# Patient Record
Sex: Male | Born: 1990 | Race: Black or African American | Hispanic: No | Marital: Single | Smoking: Current some day smoker
Health system: Southern US, Community
[De-identification: ages and names within clinical notes are randomized; demographics above are authoritative.]

## PROBLEM LIST (undated history)

## (undated) DIAGNOSIS — S02609A Fracture of mandible, unspecified, initial encounter for closed fracture: Secondary | ICD-10-CM

---
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
## 2016-01-28 ENCOUNTER — Emergency Department (HOSPITAL_COMMUNITY): Payer: Self-pay

## 2016-01-28 ENCOUNTER — Emergency Department (HOSPITAL_COMMUNITY): Payer: Self-pay | Admitting: Anesthesiology

## 2016-01-28 ENCOUNTER — Observation Stay (HOSPITAL_COMMUNITY)
Admission: EM | Admit: 2016-01-28 | Discharge: 2016-01-29 | Disposition: A | Discharge status: Home / Self Care | Payer: Self-pay | Attending: Otolaryngology | Admitting: Otolaryngology

## 2016-01-28 ENCOUNTER — Encounter (HOSPITAL_COMMUNITY): Payer: Self-pay | Admitting: Emergency Medicine

## 2016-01-28 ENCOUNTER — Encounter (HOSPITAL_COMMUNITY)
Admission: EM | Disposition: A | Discharge status: Home / Self Care | Payer: Self-pay | Source: Home / Self Care | Attending: Emergency Medicine

## 2016-01-28 DIAGNOSIS — S0993XA Unspecified injury of face, initial encounter: Secondary | ICD-10-CM

## 2016-01-28 DIAGNOSIS — S02609A Fracture of mandible, unspecified, initial encounter for closed fracture: Secondary | ICD-10-CM

## 2016-01-28 DIAGNOSIS — Y998 Other external cause status: Secondary | ICD-10-CM | POA: Insufficient documentation

## 2016-01-28 DIAGNOSIS — S02601A Fracture of unspecified part of body of right mandible, initial encounter for closed fracture: Principal | ICD-10-CM | POA: Insufficient documentation

## 2016-01-28 DIAGNOSIS — F10129 Alcohol abuse with intoxication, unspecified: Secondary | ICD-10-CM | POA: Insufficient documentation

## 2016-01-28 DIAGNOSIS — S02600B Fracture of unspecified part of body of mandible, initial encounter for open fracture: Secondary | ICD-10-CM | POA: Diagnosis present

## 2016-01-28 DIAGNOSIS — Y9389 Activity, other specified: Secondary | ICD-10-CM | POA: Insufficient documentation

## 2016-01-28 DIAGNOSIS — F10920 Alcohol use, unspecified with intoxication, uncomplicated: Secondary | ICD-10-CM

## 2016-01-28 DIAGNOSIS — S02601B Fracture of unspecified part of body of right mandible, initial encounter for open fracture: Secondary | ICD-10-CM

## 2016-01-28 DIAGNOSIS — S025XXA Fracture of tooth (traumatic), initial encounter for closed fracture: Secondary | ICD-10-CM | POA: Insufficient documentation

## 2016-01-28 DIAGNOSIS — Y9289 Other specified places as the place of occurrence of the external cause: Secondary | ICD-10-CM | POA: Insufficient documentation

## 2016-01-28 HISTORY — DX: Fracture of mandible, unspecified, initial encounter for closed fracture: S02.609A

## 2016-01-28 HISTORY — PX: ORIF MANDIBULAR FRACTURE: SHX2127

## 2016-01-28 SURGERY — OPEN REDUCTION INTERNAL FIXATION (ORIF) MANDIBULAR FRACTURE
Anesthesia: General | Site: Mouth

## 2016-01-28 MED ORDER — PROPOFOL 10 MG/ML IV BOLUS
INTRAVENOUS | Status: AC
  Filled 2016-01-28: qty 20

## 2016-01-28 MED ORDER — MIDAZOLAM HCL 2 MG/2ML IJ SOLN
INTRAMUSCULAR | Status: AC
  Filled 2016-01-28: qty 2

## 2016-01-28 MED ORDER — FENTANYL CITRATE (PF) 100 MCG/2ML IJ SOLN
INTRAMUSCULAR | Status: DC | PRN
  Administered 2016-01-28 (×2): 100 ug via INTRAVENOUS

## 2016-01-28 MED ORDER — DEXTROSE-NACL 5-0.45 % IV SOLN
INTRAVENOUS | Status: DC
  Administered 2016-01-28 – 2016-01-29 (×2): via INTRAVENOUS

## 2016-01-28 MED ORDER — SODIUM CHLORIDE 0.9 % IV SOLN
3.0000 g | Freq: Once | INTRAVENOUS | Status: AC
  Administered 2016-01-28: 3 g via INTRAVENOUS
  Filled 2016-01-28: qty 3

## 2016-01-28 MED ORDER — LACTATED RINGERS IV SOLN
Freq: Once | INTRAVENOUS | Status: AC
  Administered 2016-01-28: 50 mL/h via INTRAVENOUS

## 2016-01-28 MED ORDER — MIDAZOLAM HCL 2 MG/2ML IJ SOLN
INTRAMUSCULAR | Status: DC | PRN
  Administered 2016-01-28: 2 mg via INTRAVENOUS

## 2016-01-28 MED ORDER — MORPHINE SULFATE (PF) 4 MG/ML IV SOLN
4.0000 mg | INTRAVENOUS | Status: DC | PRN
  Administered 2016-01-28 – 2016-01-29 (×3): 4 mg via INTRAVENOUS
  Filled 2016-01-28 (×3): qty 1

## 2016-01-28 MED ORDER — ONDANSETRON HCL 4 MG/2ML IJ SOLN
4.0000 mg | Freq: Once | INTRAMUSCULAR | Status: AC
  Administered 2016-01-28: 4 mg via INTRAVENOUS
  Filled 2016-01-28: qty 2

## 2016-01-28 MED ORDER — OXYMETAZOLINE HCL 0.05 % NA SOLN
NASAL | Status: AC
  Filled 2016-01-28: qty 15

## 2016-01-28 MED ORDER — FENTANYL CITRATE (PF) 100 MCG/2ML IJ SOLN
INTRAMUSCULAR | Status: AC
  Filled 2016-01-28: qty 2

## 2016-01-28 MED ORDER — LIDOCAINE-EPINEPHRINE 1 %-1:100000 IJ SOLN
INTRAMUSCULAR | Status: DC | PRN
  Administered 2016-01-28: 5 mL

## 2016-01-28 MED ORDER — LIDOCAINE 2% (20 MG/ML) 5 ML SYRINGE
INTRAMUSCULAR | Status: DC | PRN
  Administered 2016-01-28: 60 mg via INTRAVENOUS

## 2016-01-28 MED ORDER — LIDOCAINE-EPINEPHRINE 1 %-1:100000 IJ SOLN
INTRAMUSCULAR | Status: AC
  Filled 2016-01-28: qty 1

## 2016-01-28 MED ORDER — ONDANSETRON 4 MG PO TBDP: 4.0000 mg | ORAL_TABLET | Freq: Four times a day (QID) | ORAL | Status: DC | PRN

## 2016-01-28 MED ORDER — OXYMETAZOLINE HCL 0.05 % NA SOLN
NASAL | Status: DC | PRN
  Administered 2016-01-28: 2 via TOPICAL

## 2016-01-28 MED ORDER — SUCCINYLCHOLINE CHLORIDE 200 MG/10ML IV SOSY
PREFILLED_SYRINGE | INTRAVENOUS | Status: AC
  Filled 2016-01-28: qty 10

## 2016-01-28 MED ORDER — MORPHINE SULFATE (PF) 4 MG/ML IV SOLN
4.0000 mg | Freq: Once | INTRAVENOUS | Status: AC
  Administered 2016-01-28: 4 mg via INTRAVENOUS
  Filled 2016-01-28: qty 1

## 2016-01-28 MED ORDER — PROMETHAZINE HCL 25 MG/ML IJ SOLN: 6.2500 mg | INTRAMUSCULAR | Status: DC | PRN

## 2016-01-28 MED ORDER — BACIT-POLY-NEO HC 1 % EX OINT
TOPICAL_OINTMENT | CUTANEOUS | Status: AC
  Filled 2016-01-28: qty 15

## 2016-01-28 MED ORDER — ONDANSETRON HCL 4 MG/2ML IJ SOLN
INTRAMUSCULAR | Status: AC
  Filled 2016-01-28: qty 2

## 2016-01-28 MED ORDER — LIDOCAINE 2% (20 MG/ML) 5 ML SYRINGE
INTRAMUSCULAR | Status: AC
  Filled 2016-01-28: qty 5

## 2016-01-28 MED ORDER — PROPOFOL 10 MG/ML IV BOLUS
INTRAVENOUS | Status: DC | PRN
  Administered 2016-01-28: 40 mg via INTRAVENOUS
  Administered 2016-01-28: 160 mg via INTRAVENOUS

## 2016-01-28 MED ORDER — HYDROMORPHONE HCL 1 MG/ML IJ SOLN: 0.2500 mg | INTRAMUSCULAR | Status: DC | PRN

## 2016-01-28 MED ORDER — SUCCINYLCHOLINE CHLORIDE 200 MG/10ML IV SOSY
PREFILLED_SYRINGE | INTRAVENOUS | Status: DC | PRN
  Administered 2016-01-28: 120 mg via INTRAVENOUS

## 2016-01-28 MED ORDER — OXYMETAZOLINE HCL 0.05 % NA SOLN
NASAL | Status: DC | PRN
  Administered 2016-01-28: 2 via NASAL

## 2016-01-28 MED ORDER — CLINDAMYCIN PHOSPHATE 600 MG/50ML IV SOLN
INTRAVENOUS | Status: DC | PRN
  Administered 2016-01-28: 600 mg via INTRAVENOUS

## 2016-01-28 MED ORDER — SCOPOLAMINE 1 MG/3DAYS TD PT72
MEDICATED_PATCH | TRANSDERMAL | Status: DC | PRN
  Administered 2016-01-28: 1 via TRANSDERMAL

## 2016-01-28 MED ORDER — LACTATED RINGERS IV SOLN
INTRAVENOUS | Status: DC | PRN
  Administered 2016-01-28 (×3): via INTRAVENOUS

## 2016-01-28 MED ORDER — ONDANSETRON HCL 4 MG/2ML IJ SOLN: 4.0000 mg | Freq: Four times a day (QID) | INTRAMUSCULAR | Status: DC | PRN

## 2016-01-28 MED ORDER — TETANUS-DIPHTH-ACELL PERTUSSIS 5-2.5-18.5 LF-MCG/0.5 IM SUSP
0.5000 mL | Freq: Once | INTRAMUSCULAR | Status: AC
  Administered 2016-01-28: 0.5 mL via INTRAMUSCULAR
  Filled 2016-01-28: qty 0.5

## 2016-01-28 MED ORDER — SCOPOLAMINE 1 MG/3DAYS TD PT72
MEDICATED_PATCH | TRANSDERMAL | Status: AC
  Filled 2016-01-28: qty 1

## 2016-01-28 MED ORDER — HYDROCODONE-ACETAMINOPHEN 7.5-325 MG/15ML PO SOLN
10.0000 mL | ORAL | Status: DC | PRN
  Administered 2016-01-28: 10 mL via ORAL
  Filled 2016-01-28 (×2): qty 15

## 2016-01-28 MED ORDER — CLINDAMYCIN PHOSPHATE 600 MG/50ML IV SOLN
600.0000 mg | Freq: Four times a day (QID) | INTRAVENOUS | Status: DC
Start: 2016-01-28 — End: 2016-01-29
  Administered 2016-01-28 – 2016-01-29 (×3): 600 mg via INTRAVENOUS
  Filled 2016-01-28 (×5): qty 50

## 2016-01-28 MED ORDER — 0.9 % SODIUM CHLORIDE (POUR BTL) OPTIME
TOPICAL | Status: DC | PRN
  Administered 2016-01-28: 1000 mL

## 2016-01-28 MED ORDER — BACIT-POLY-NEO HC 1 % EX OINT
TOPICAL_OINTMENT | CUTANEOUS | Status: DC | PRN
  Administered 2016-01-28: 1

## 2016-01-28 MED ORDER — DEXAMETHASONE SODIUM PHOSPHATE 10 MG/ML IJ SOLN
INTRAMUSCULAR | Status: DC | PRN
  Administered 2016-01-28: 10 mg via INTRAVENOUS

## 2016-01-28 MED ORDER — ONDANSETRON HCL 4 MG/2ML IJ SOLN
INTRAMUSCULAR | Status: DC | PRN
  Administered 2016-01-28: 4 mg via INTRAVENOUS

## 2016-01-28 MED ORDER — DEXAMETHASONE SODIUM PHOSPHATE 10 MG/ML IJ SOLN
INTRAMUSCULAR | Status: AC
  Filled 2016-01-28: qty 1

## 2016-01-28 MED ORDER — CLINDAMYCIN PHOSPHATE 600 MG/50ML IV SOLN
INTRAVENOUS | Status: AC
  Filled 2016-01-28: qty 50

## 2016-01-28 SURGICAL SUPPLY — 45 items
BIT DRILL TWIST 1.3X79 (BIT) ×1 IMPLANT
BLADE 10 SAFETY STRL DISP (BLADE) IMPLANT
BLADE SURG 15 STRL LF DISP TIS (BLADE) ×1 IMPLANT
BLADE SURG 15 STRL SS (BLADE) ×2
CANISTER SUCTION 2500CC (MISCELLANEOUS) ×3 IMPLANT
CLEANER TIP ELECTROSURG 2X2 (MISCELLANEOUS) ×3 IMPLANT
CONFORMERS SILICONE 5649 (OPHTHALMIC RELATED) IMPLANT
COVER SURGICAL LIGHT HANDLE (MISCELLANEOUS) ×3 IMPLANT
CRADLE DONUT ADULT HEAD (MISCELLANEOUS) ×3 IMPLANT
DECANTER SPIKE VIAL GLASS SM (MISCELLANEOUS) ×3 IMPLANT
DRAPE PROXIMA HALF (DRAPES) IMPLANT
DRILL BIT TWIST 1.3X79MM (BIT) ×2
DRSG MEPILEX BORDER 4X4 (GAUZE/BANDAGES/DRESSINGS) ×3 IMPLANT
ELECT COATED BLADE 2.86 ST (ELECTRODE) ×3 IMPLANT
ELECT NEEDLE BLADE 2-5/6 (NEEDLE) IMPLANT
ELECT REM PT RETURN 9FT ADLT (ELECTROSURGICAL) ×3
ELECTRODE REM PT RTRN 9FT ADLT (ELECTROSURGICAL) ×1 IMPLANT
GLOVE ECLIPSE 7.5 STRL STRAW (GLOVE) ×3 IMPLANT
GOWN STRL REUS W/ TWL LRG LVL3 (GOWN DISPOSABLE) ×2 IMPLANT
GOWN STRL REUS W/TWL LRG LVL3 (GOWN DISPOSABLE) ×4
KIT BASIN OR (CUSTOM PROCEDURE TRAY) ×3 IMPLANT
KIT ROOM TURNOVER OR (KITS) ×3 IMPLANT
NEEDLE HYPO 25GX1X1/2 BEV (NEEDLE) ×3 IMPLANT
NS IRRIG 1000ML POUR BTL (IV SOLUTION) ×3 IMPLANT
PAD ARMBOARD 7.5X6 YLW CONV (MISCELLANEOUS) ×6 IMPLANT
PATTIES SURGICAL .5 X3 (DISPOSABLE) ×3 IMPLANT
PENCIL FOOT CONTROL (ELECTRODE) ×3 IMPLANT
PLATE HYBRID MMF (Plate) ×6 IMPLANT
PLATE MID FACE 4H CURVED (Plate) ×3 IMPLANT
PLATE MID FACE 6H L 8MM (Plate) ×3 IMPLANT
SCREW 1.7X6 CROSS-PINSTRLELF-D (Screw) ×3 IMPLANT
SCREW LOCK SELFDRIL 2.0X8M MMF (Screw) ×48 IMPLANT
SCREW MIDFACE 1.7X4 SLF DRILL (Screw) ×6 IMPLANT
SCREW UPPER FACE 2.0X12MM (Screw) ×6 IMPLANT
SUT CHROMIC 3 0 PS 2 (SUTURE) ×3 IMPLANT
SUT CHROMIC 4 0 PS 2 18 (SUTURE) IMPLANT
SUT ETHILON 5 0 P 3 18 (SUTURE)
SUT NYLON ETHILON 5-0 P-3 1X18 (SUTURE) IMPLANT
SUT SILK 2 0 FS (SUTURE) IMPLANT
SUT STEEL 0 (SUTURE)
SUT STEEL 0 18XMFL TIE 17 (SUTURE) IMPLANT
SUT STEEL 4 (SUTURE) ×3 IMPLANT
TOWEL OR 17X24 6PK STRL BLUE (TOWEL DISPOSABLE) ×3 IMPLANT
TRAY ENT MC OR (CUSTOM PROCEDURE TRAY) ×3 IMPLANT
WATER STERILE IRR 1000ML POUR (IV SOLUTION) ×3 IMPLANT
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
## 2016-01-28 NOTE — Anesthesia Preprocedure Evaluation (Signed)
Anesthesia Evaluation  Patient identified by MRN, date of birth, ID band Patient awake  General Assessment Comment:Fractured jaw  Reviewed: Allergy & Precautions, NPO status , Patient's Chart, lab work & pertinent test results  Airway Mallampati: II   Neck ROM: Full  Mouth opening: Limited Mouth Opening  Dental  (+) Teeth Intact, Dental Advisory Given   Pulmonary neg pulmonary ROS,    breath sounds clear to auscultation       Cardiovascular negative cardio ROS   Rhythm:Regular Rate:Normal     Neuro/Psych negative neurological ROS  negative psych ROS   GI/Hepatic negative GI ROS, Neg liver ROS, (+)     substance abuse  alcohol use,   Endo/Other  negative endocrine ROS  Renal/GU negative Renal ROS  negative genitourinary   Musculoskeletal negative musculoskeletal ROS (+)   Abdominal   Peds negative pediatric ROS (+)  Hematology negative hematology ROS (+)   Anesthesia Other Findings   Reproductive/Obstetrics negative OB ROS                             Anesthesia Physical Anesthesia Plan  ASA: I and emergent  Anesthesia Plan: General   Post-op Pain Management:    Induction: Intravenous  Airway Management Planned: Nasal ETT  Additional Equipment:   Intra-op Plan:   Post-operative Plan: Extubation in OR  Informed Consent: I have reviewed the patients History and Physical, chart, labs and discussed the procedure including the risks, benefits and alternatives for the proposed anesthesia with the patient or authorized representative who has indicated his/her understanding and acceptance.   Dental advisory given  Plan Discussed with:   Anesthesia Plan Comments:         Anesthesia Quick Evaluation

## 2016-01-28 NOTE — Anesthesia Procedure Notes (Signed)
Procedure Name: Intubation Date/Time: 01/28/2016 2:17 PM Performed by: Alanda AmassFRIEDMAN, Maeve Debord A Pre-anesthesia Checklist: Patient identified, Emergency Drugs available, Suction available, Patient being monitored and Timeout performed Patient Re-evaluated:Patient Re-evaluated prior to inductionOxygen Delivery Method: Circle System Utilized and Circle system utilized Preoxygenation: Pre-oxygenation with 100% oxygen Intubation Type: IV induction Ventilation: Mask ventilation without difficulty Laryngoscope Size: Glidescope Nasal Tubes: Nasal Rae and Nasal prep performed Tube size: 7.0 mm Number of attempts: 1 Airway Equipment and Method: Video-laryngoscopy Placement Confirmation: ETT inserted through vocal cords under direct vision,  positive ETCO2 and breath sounds checked- equal and bilateral Secured at: 26 cm Tube secured with: Tape Dental Injury: Teeth and Oropharynx as per pre-operative assessment  Comments: Lt. nare

## 2016-01-28 NOTE — Anesthesia Postprocedure Evaluation (Signed)
Anesthesia Post Note  Patient: Collin StarrMichael L Frieden Jr.  Procedure(s) Performed: Procedure(s) (LRB): OPEN REDUCTION INTERNAL FIXATION (ORIF) MANDIBULAR FRACTURE (N/A)  Patient location during evaluation: PACU Anesthesia Type: General Level of consciousness: awake and alert Pain management: pain level controlled Vital Signs Assessment: post-procedure vital signs reviewed and stable Respiratory status: spontaneous breathing, nonlabored ventilation, respiratory function stable and patient connected to nasal cannula oxygen Cardiovascular status: blood pressure returned to baseline and stable Postop Assessment: no signs of nausea or vomiting Anesthetic complications: no    Last Vitals:  Vitals:   01/28/16 1645 01/28/16 1700  BP: (!) 155/79 (!) 154/77  Pulse: 96 (!) 102  Resp: 16 14  Temp: 37.2 C     Last Pain:  Vitals:   01/28/16 1645  TempSrc:   PainSc: 0-No pain                 Priyah Schmuck,JAMES TERRILL
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
## 2016-01-28 NOTE — Progress Notes (Signed)
Spoke with Dr. Jacklynn BueMassagee who stated no labs were needed.
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
## 2016-01-28 NOTE — ED Notes (Signed)
Bed: WA09 Expected date:  Expected time:  Means of arrival:  Comments: 25yo M ETOH

## 2016-01-28 NOTE — Progress Notes (Signed)
Pt awake , alert, low pain, ice pack to jaws, no nausea, no oral bleeding noted

## 2016-01-28 NOTE — ED Notes (Signed)
Patient awake and spitting blood into sink. PA and RN made aware.

## 2016-01-28 NOTE — Transfer of Care (Signed)
Immediate Anesthesia Transfer of Care Note  Patient: Collin StarrMichael L Sailer Jr.  Procedure(s) Performed: Procedure(s): OPEN REDUCTION INTERNAL FIXATION (ORIF) MANDIBULAR FRACTURE (N/A)  Patient Location: PACU  Anesthesia Type:General  Level of Consciousness: awake  Airway & Oxygen Therapy: Patient Spontanous Breathing and Patient connected to nasal cannula oxygen  Post-op Assessment: Report given to RN and Post -op Vital signs reviewed and stable  Post vital signs: Reviewed and stable  Last Vitals:  Vitals:   01/28/16 1100 01/28/16 1611  BP: 131/61   Pulse: 78 (!) (P) 107  Resp: 16 (P) 12  Temp:  (P) 36.8 C    Last Pain:  Vitals:   01/28/16 1121  TempSrc:   PainSc: 9          Complications: No apparent anesthesia complications

## 2016-01-29 MED ORDER — CEPHALEXIN 250 MG/5ML PO SUSR
500.0000 mg | Freq: Three times a day (TID) | ORAL | Status: DC
  Administered 2016-01-29: 500 mg via ORAL
  Filled 2016-01-29 (×2): qty 10

## 2016-01-29 MED ORDER — CEPHALEXIN 250 MG/5ML PO SUSR: 500.0000 mg | Freq: Three times a day (TID) | ORAL | 0 refills | Status: DC

## 2016-01-29 MED ORDER — HYDROCODONE-ACETAMINOPHEN 7.5-325 MG/15ML PO SOLN: 10.0000 mL | ORAL | 0 refills | Status: DC | PRN

## 2016-01-29 NOTE — Discharge Summary (Signed)
Physician Discharge Summary  Patient ID: Collin StarrMichael L Stefano Jr. MRN: [id number] DOB/AGE: [birth year] 25 y.o.  Admit date: 01/28/2016 Discharge date: 01/29/2016  Admission Diagnoses:Mandible fracture  Discharge Diagnoses: Same Active Problems:   Open body of mandible fracture [facility](HCC)   Discharged Condition: good  Hospital Course: Patient was admitted for open reduction internal fixation of mandible fracture. He tolerated procedure well. He postop day 1 was doing well. Pain was controlled. Wires seem tight and Tc no be lined up. He was discharged with his wire cutters. He's to follow-up in one week. Discharge instructions were given.  Consults: None  Significant Diagnostic Studies: None  Treatments: surgery: As above  Discharge Exam: Blood pressure 130/75, pulse 63, temperature 97.7 F (36.5 C), temperature source Axillary, resp. rate 17, SpO2 100 %. Awake and alert. Wires are tight. No breathing difficulty. No excessive swelling. Lungs are clear. Heart is regular. Extremities no swelling or tenderness.  Disposition: Final discharge disposition not confirmed  Discharge Instructions    Call MD for:  difficulty breathing, headache or visual disturbances    Complete by:  As directed    Call MD for:  extreme fatigue    Complete by:  As directed    Call MD for:  hives    Complete by:  As directed    Call MD for:  persistant dizziness or light-headedness    Complete by:  As directed    Call MD for:  persistant nausea and vomiting    Complete by:  As directed    Call MD for:  redness, tenderness, or signs of infection (pain, swelling, redness, odor or green/yellow discharge around incision site)    Complete by:  As directed    Call MD for:  severe uncontrolled pain    Complete by:  As directed    Call MD for:  temperature >100.4    Complete by:  As directed    Diet - low sodium heart healthy    Complete by:  As directed    Discharge instructions    Complete by:  As directed    Call if any increasing swelling, pain, or any of the wires her teeth are loose. Follow-up in one week in the office at [phone]. Take the antibiotic as prescribed. Clean the teeth with a WaterPik or salt water gargle. Full liquid diet is usually the typical calorie intake. Call with any questions whatsoever. You must keep your wire cutters with you at all times for any emergency to cut the wires.   Increase activity slowly    Complete by:  As directed        Medication List    TAKE these medications   cephALEXin 250 MG/5ML suspension Commonly known as:  KEFLEX Take 10 mLs (500 mg total) by mouth every 8 (eight) hours.   HYDROcodone-acetaminophen 7.5-325 mg/15 ml solution Commonly known as:  HYCET Take 10 mLs by mouth every 4 (four) hours as needed for moderate pain.   hydroxypropyl methylcellulose / hypromellose 2.5 % ophthalmic solution Commonly known as:  ISOPTO TEARS / GONIOVISC Place 1 drop into both eyes 3 (three) times daily as needed for dry eyes.        SignedSuzanna Obey: Collin Sullivan 01/29/2016, 8:27 AM

## 2016-01-29 NOTE — Progress Notes (Signed)
Pt was up and ambulating in the room. Good pain relief from pain meds this morning. Min swelling to face, dry abrasions to right face.  Dr Jearld FentonByers seen pt this morning w/ discharge order. Discharge instructions given to pt, wire cutter given.  Discharged home accompanied by mother.

## 2016-02-01 ENCOUNTER — Encounter (HOSPITAL_COMMUNITY): Payer: Self-pay | Admitting: Otolaryngology

## 2016-03-04 ENCOUNTER — Encounter (HOSPITAL_BASED_OUTPATIENT_CLINIC_OR_DEPARTMENT_OTHER): Payer: Self-pay | Admitting: *Deleted

## 2016-03-04 ENCOUNTER — Other Ambulatory Visit: Payer: Self-pay | Admitting: Otolaryngology

## 2016-03-07 ENCOUNTER — Encounter (HOSPITAL_BASED_OUTPATIENT_CLINIC_OR_DEPARTMENT_OTHER)
Admission: RE | Disposition: A | Discharge status: Home / Self Care | Payer: Self-pay | Source: Ambulatory Visit | Attending: Otolaryngology

## 2016-03-07 ENCOUNTER — Encounter (HOSPITAL_BASED_OUTPATIENT_CLINIC_OR_DEPARTMENT_OTHER): Payer: Self-pay | Admitting: *Deleted

## 2016-03-07 ENCOUNTER — Ambulatory Visit (HOSPITAL_BASED_OUTPATIENT_CLINIC_OR_DEPARTMENT_OTHER): Payer: Self-pay | Admitting: Anesthesiology

## 2016-03-07 ENCOUNTER — Ambulatory Visit (HOSPITAL_BASED_OUTPATIENT_CLINIC_OR_DEPARTMENT_OTHER)
Admission: RE | Admit: 2016-03-07 | Discharge: 2016-03-07 | Disposition: A | Discharge status: Home / Self Care | Payer: Self-pay | Source: Ambulatory Visit | Attending: Otolaryngology | Admitting: Otolaryngology

## 2016-03-07 DIAGNOSIS — Z472 Encounter for removal of internal fixation device: Secondary | ICD-10-CM | POA: Insufficient documentation

## 2016-03-07 DIAGNOSIS — F17200 Nicotine dependence, unspecified, uncomplicated: Secondary | ICD-10-CM | POA: Insufficient documentation

## 2016-03-07 HISTORY — PX: MANDIBULAR HARDWARE REMOVAL: SHX5205

## 2016-03-07 HISTORY — DX: Fracture of mandible, unspecified, initial encounter for closed fracture: S02.609A

## 2016-03-07 SURGERY — REMOVAL, HARDWARE, MANDIBLE
Anesthesia: General | Site: Mouth | Laterality: Bilateral

## 2016-03-07 MED ORDER — SCOPOLAMINE 1 MG/3DAYS TD PT72: 1.0000 | MEDICATED_PATCH | Freq: Once | TRANSDERMAL | Status: DC | PRN

## 2016-03-07 MED ORDER — MIDAZOLAM HCL 2 MG/2ML IJ SOLN
INTRAMUSCULAR | Status: AC
  Filled 2016-03-07: qty 2

## 2016-03-07 MED ORDER — FENTANYL CITRATE (PF) 100 MCG/2ML IJ SOLN
50.0000 ug | INTRAMUSCULAR | Status: DC | PRN
  Administered 2016-03-07: 100 ug via INTRAVENOUS

## 2016-03-07 MED ORDER — ONDANSETRON HCL 4 MG/2ML IJ SOLN
INTRAMUSCULAR | Status: AC
  Filled 2016-03-07: qty 2

## 2016-03-07 MED ORDER — PROMETHAZINE HCL 25 MG/ML IJ SOLN: 6.2500 mg | INTRAMUSCULAR | Status: DC | PRN

## 2016-03-07 MED ORDER — BACITRACIN 500 UNIT/GM EX OINT
TOPICAL_OINTMENT | CUTANEOUS | Status: DC | PRN
  Administered 2016-03-07: 1 via TOPICAL

## 2016-03-07 MED ORDER — DEXAMETHASONE SODIUM PHOSPHATE 4 MG/ML IJ SOLN
INTRAMUSCULAR | Status: DC | PRN
  Administered 2016-03-07: 10 mg via INTRAVENOUS

## 2016-03-07 MED ORDER — DEXAMETHASONE SODIUM PHOSPHATE 10 MG/ML IJ SOLN
INTRAMUSCULAR | Status: AC
  Filled 2016-03-07: qty 1

## 2016-03-07 MED ORDER — PROPOFOL 10 MG/ML IV BOLUS
INTRAVENOUS | Status: DC | PRN
  Administered 2016-03-07: 50 mg via INTRAVENOUS
  Administered 2016-03-07: 150 mg via INTRAVENOUS

## 2016-03-07 MED ORDER — FENTANYL CITRATE (PF) 100 MCG/2ML IJ SOLN: 25.0000 ug | INTRAMUSCULAR | Status: DC | PRN

## 2016-03-07 MED ORDER — FENTANYL CITRATE (PF) 100 MCG/2ML IJ SOLN
INTRAMUSCULAR | Status: AC
  Filled 2016-03-07: qty 2

## 2016-03-07 MED ORDER — LIDOCAINE-EPINEPHRINE 2 %-1:100000 IJ SOLN
INTRAMUSCULAR | Status: AC
Start: 2016-03-07 — End: 2016-03-07
  Filled 2016-03-07: qty 1.7

## 2016-03-07 MED ORDER — MEPERIDINE HCL 25 MG/ML IJ SOLN: 6.2500 mg | INTRAMUSCULAR | Status: DC | PRN

## 2016-03-07 MED ORDER — MIDAZOLAM HCL 2 MG/2ML IJ SOLN: 0.5000 mg | Freq: Once | INTRAMUSCULAR | Status: DC | PRN

## 2016-03-07 MED ORDER — ONDANSETRON HCL 4 MG/2ML IJ SOLN
INTRAMUSCULAR | Status: DC | PRN
  Administered 2016-03-07: 4 mg via INTRAVENOUS

## 2016-03-07 MED ORDER — MIDAZOLAM HCL 2 MG/2ML IJ SOLN
1.0000 mg | INTRAMUSCULAR | Status: DC | PRN
  Administered 2016-03-07: 2 mg via INTRAVENOUS

## 2016-03-07 MED ORDER — LACTATED RINGERS IV SOLN
INTRAVENOUS | Status: DC
  Administered 2016-03-07: 12:00:00 via INTRAVENOUS

## 2016-03-07 SURGICAL SUPPLY — 26 items
BLADE SURG 15 STRL LF DISP TIS (BLADE) ×1 IMPLANT
BLADE SURG 15 STRL SS (BLADE) ×2
CANISTER SUCT 1200ML W/VALVE (MISCELLANEOUS) ×3 IMPLANT
CORDS BIPOLAR (ELECTRODE) IMPLANT
COVER MAYO STAND STRL (DRAPES) ×3 IMPLANT
DECANTER SPIKE VIAL GLASS SM (MISCELLANEOUS) ×3 IMPLANT
GLOVE BIOGEL PI IND STRL 7.5 (GLOVE) ×1 IMPLANT
GLOVE BIOGEL PI INDICATOR 7.5 (GLOVE) ×2
GLOVE SS BIOGEL STRL SZ 7.5 (GLOVE) ×1 IMPLANT
GLOVE SUPERSENSE BIOGEL SZ 7.5 (GLOVE) ×2
GLOVE SURG SYN 7.5  E (GLOVE) ×2
GLOVE SURG SYN 7.5 E (GLOVE) ×1 IMPLANT
GOWN STRL REUS W/ TWL LRG LVL3 (GOWN DISPOSABLE) ×2 IMPLANT
GOWN STRL REUS W/TWL LRG LVL3 (GOWN DISPOSABLE) ×4
MARKER SKIN DUAL TIP RULER LAB (MISCELLANEOUS) IMPLANT
NEEDLE PRECISIONGLIDE 27X1.5 (NEEDLE) ×3 IMPLANT
PACK BASIN DAY SURGERY FS (CUSTOM PROCEDURE TRAY) ×3 IMPLANT
SCISSORS WIRE ANG 4 3/4 DISP (INSTRUMENTS) IMPLANT
SHEET MEDIUM DRAPE 40X70 STRL (DRAPES) ×3 IMPLANT
SUT CHROMIC 3 0 PS 2 (SUTURE) IMPLANT
SUT CHROMIC 4 0 PS 2 18 (SUTURE) IMPLANT
SYR CONTROL 10ML LL (SYRINGE) ×3 IMPLANT
TOWEL OR 17X24 6PK STRL BLUE (TOWEL DISPOSABLE) ×6 IMPLANT
TUBE CONNECTING 20'X1/4 (TUBING) ×1
TUBE CONNECTING 20X1/4 (TUBING) ×2 IMPLANT
YANKAUER SUCT BULB TIP NO VENT (SUCTIONS) ×3 IMPLANT

## 2016-03-07 NOTE — Anesthesia Postprocedure Evaluation (Signed)
Anesthesia Post Note  Patient: Collin StarrMichael L Breau Jr.  Procedure(s) Performed: Procedure(s) (LRB): MANDIBULAR HARDWARE REMOVAL (Bilateral)  Patient location during evaluation: PACU Anesthesia Type: General Level of consciousness: awake and alert, patient cooperative and oriented Pain management: pain level controlled Vital Signs Assessment: post-procedure vital signs reviewed and stable Respiratory status: spontaneous breathing, nonlabored ventilation and respiratory function stable Cardiovascular status: blood pressure returned to baseline and stable Postop Assessment: no signs of nausea or vomiting Anesthetic complications: no    Last Vitals:  Vitals:   03/07/16 1505 03/07/16 1519  BP:  117/74  Pulse: (!) 50 90  Resp: 14 16  Temp:  36.6 C    Last Pain:  Vitals:   03/07/16 1519  TempSrc: Oral  PainSc: 0-No pain                 Collin Sullivan,E. Natina Wiginton

## 2016-03-07 NOTE — Discharge Instructions (Signed)
Stay on a soft diet for another week at least. Follow-up in 2-3 weeks for recheck in the office. Gargle with some salt water to cleanse the teeth for about 2 or 3 days. Call if there is any issues, concerns, or problems.     Post Anesthesia Home Care Instructions  Activity: Get plenty of rest for the remainder of the day. A responsible adult should stay with you for 24 hours following the procedure.  For the next 24 hours, DO NOT: -Drive a car -Advertising copywriterperate machinery -Drink alcoholic beverages -Take any medication unless instructed by your physician -Make any legal decisions or sign important papers.  Meals: Start with liquid foods such as gelatin or soup. Progress to regular foods as tolerated. Avoid greasy, spicy, heavy foods. If nausea and/or vomiting occur, drink only clear liquids until the nausea and/or vomiting subsides. Call your physician if vomiting continues.  Special Instructions/Symptoms: Your throat may feel dry or sore from the anesthesia or the breathing tube placed in your throat during surgery. If this causes discomfort, gargle with warm salt water. The discomfort should disappear within 24 hours.  If you had a scopolamine patch placed behind your ear for the management of post- operative nausea and/or vomiting:  1. The medication in the patch is effective for 72 hours, after which it should be removed.  Wrap patch in a tissue and discard in the trash. Wash hands thoroughly with soap and water. 2. You may remove the patch earlier than 72 hours if you experience unpleasant side effects which may include dry mouth, dizziness or visual disturbances. 3. Avoid touching the patch. Wash your hands with soap and water after contact with the patch.   Call your surgeon if you experience:   1.  Fever over 101.0. 2.  Inability to urinate. 3.  Nausea and/or vomiting. 4.  Extreme swelling or bruising at the surgical site. 5.  Continued bleeding from the incision. 6.  Increased pain,  redness or drainage from the incision. 7.  Problems related to your pain medication. 8.  Any problems and/or concerns
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
## 2016-03-07 NOTE — H&P (Signed)
Collin StarrMichael L Suchy Jr. is an 25 y.o. male.   Chief Complaint: jaw fracture HPI: hx of mandible fx and now needs arch bars off  Past Medical History:  Diagnosis Date  . Mandible fracture (HCC) 01/28/2016    Past Surgical History:  Procedure Laterality Date  . ORIF MANDIBULAR FRACTURE N/A 01/28/2016   Procedure: OPEN REDUCTION INTERNAL FIXATION (ORIF) MANDIBULAR FRACTURE;  Surgeon: Suzanna ObeyJohn Kynnadi Dicenso, MD;  [facility];  Service: ENT;  Laterality: N/A;    History reviewed. No pertinent family history. Social History:  reports that he has been smoking.  He has smoked for the past 4.00 years. He has never used smokeless tobacco. He reports that he drinks alcohol. He reports that he does not use drugs.  Allergies: No Known Allergies  No prescriptions prior to admission.    No results found for this or any previous visit (from the past 48 hour(s)). No results found.  Review of Systems  Constitutional: Negative.   HENT: Negative.   Eyes: Negative.   Respiratory: Negative.   Cardiovascular: Negative.   Skin: Negative.   Neurological: Negative.     Blood pressure (!) 108/59, pulse (!) 59, temperature 98.1 F (36.7 C), temperature source Oral, resp. rate 18, height 6\' 1"  (1.854 m), weight 73 kg (161 lb), SpO2 100 %. Physical Exam  Constitutional: He appears well-developed and well-nourished.  HENT:  Head: Normocephalic and atraumatic.  Nose: Nose normal.  Eyes: Conjunctivae are normal. Pupils are equal, round, and reactive to light.  Neck: Normal range of motion. Neck supple.  Cardiovascular: Normal rate.   Respiratory: Effort normal.  GI: Soft.  Musculoskeletal: Normal range of motion.     Assessment/Plan Mandible fracture- he has healed well with no problems and good occlusion and now for arch bar removal./  Suzanna ObeyBYERS, Collin Fuster, MD 03/07/2016, 1:00 PM

## 2016-03-07 NOTE — Anesthesia Preprocedure Evaluation (Addendum)
Anesthesia Evaluation  Patient identified by MRN, date of birth, ID band Patient awake    Reviewed: Allergy & Precautions, NPO status , Patient's Chart, lab work & pertinent test results  History of Anesthesia Complications Negative for: history of anesthetic complications  Airway Mallampati: II  TM Distance: >3 FB Neck ROM: Full    Dental  (+) Dental Advisory Given   Pulmonary Current Smoker,    breath sounds clear to auscultation       Cardiovascular negative cardio ROS   Rhythm:Regular Rate:Normal     Neuro/Psych negative neurological ROS     GI/Hepatic negative GI ROS, Neg liver ROS,   Endo/Other  negative endocrine ROS  Renal/GU negative Renal ROS     Musculoskeletal   Abdominal   Peds  Hematology negative hematology ROS (+)   Anesthesia Other Findings   Reproductive/Obstetrics                            Anesthesia Physical Anesthesia Plan  ASA: II  Anesthesia Plan: General   Post-op Pain Management:    Induction: Intravenous  Airway Management Planned: Mask  Additional Equipment:   Intra-op Plan:   Post-operative Plan:   Informed Consent: I have reviewed the patients History and Physical, chart, labs and discussed the procedure including the risks, benefits and alternatives for the proposed anesthesia with the patient or authorized representative who has indicated his/her understanding and acceptance.   Dental advisory given  Plan Discussed with: CRNA and Surgeon  Anesthesia Plan Comments: (Plan routine monitors, GA)        Anesthesia Quick Evaluation

## 2016-03-07 NOTE — Transfer of Care (Signed)
Immediate Anesthesia Transfer of Care Note  Patient: Collin StarrMichael L Peckman Jr.  Procedure(s) Performed: Procedure(s): MANDIBULAR HARDWARE REMOVAL (Bilateral)  Patient Location: PACU  Anesthesia Type:General  Level of Consciousness: sedated and responds to stimulation  Airway & Oxygen Therapy: Patient Spontanous Breathing and Patient connected to face mask oxygen  Post-op Assessment: Report given to RN and Post -op Vital signs reviewed and stable  Post vital signs: Reviewed and stable  Last Vitals:  Vitals:   03/07/16 1409 03/07/16 1410  BP:    Pulse: 69 67  Resp:  14  Temp:      Last Pain:  Vitals:   03/07/16 1206  TempSrc: Oral  PainSc:       Patients Stated Pain Goal: 0 (03/07/16 1206)  Complications: No apparent anesthesia complications

## 2016-03-08 ENCOUNTER — Encounter (HOSPITAL_BASED_OUTPATIENT_CLINIC_OR_DEPARTMENT_OTHER): Payer: Self-pay | Admitting: Otolaryngology
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
